# Patient Record
Sex: Male | Born: 1960 | Race: Black or African American | Hispanic: No | Marital: Married | Smoking: Former smoker
Health system: Southern US, Community
[De-identification: ages and names within clinical notes are randomized; demographics above are authoritative.]

## PROBLEM LIST (undated history)

## (undated) DIAGNOSIS — I10 Essential (primary) hypertension: Secondary | ICD-10-CM

## (undated) DIAGNOSIS — M722 Plantar fascial fibromatosis: Secondary | ICD-10-CM

## (undated) HISTORY — DX: Plantar fascial fibromatosis: M72.2

## (undated) HISTORY — PX: MOUTH SURGERY: SHX715

## (undated) HISTORY — PX: TONSILLECTOMY: SHX5217

## (undated) HISTORY — DX: Essential (primary) hypertension: I10

---

## 2004-07-26 ENCOUNTER — Emergency Department: Payer: Self-pay | Admitting: General Practice

## 2013-05-02 ENCOUNTER — Encounter: Payer: Self-pay | Admitting: Podiatry

## 2013-05-15 ENCOUNTER — Encounter: Payer: Self-pay | Admitting: Podiatry

## 2013-05-15 ENCOUNTER — Ambulatory Visit: Payer: Self-pay

## 2013-05-15 ENCOUNTER — Ambulatory Visit (INDEPENDENT_AMBULATORY_CARE_PROVIDER_SITE_OTHER): Payer: BC Managed Care – PPO | Admitting: Podiatry

## 2013-05-15 VITALS — BP 144/96 | HR 79 | Resp 16 | Ht 69.0 in | Wt 259.0 lb

## 2013-05-15 DIAGNOSIS — M722 Plantar fascial fibromatosis: Secondary | ICD-10-CM

## 2013-05-15 DIAGNOSIS — M76829 Posterior tibial tendinitis, unspecified leg: Secondary | ICD-10-CM

## 2013-05-15 DIAGNOSIS — M76822 Posterior tibial tendinitis, left leg: Secondary | ICD-10-CM

## 2013-05-15 MED ORDER — METHYLPREDNISOLONE (PAK) 4 MG PO TABS: ORAL_TABLET | ORAL | Status: DC

## 2013-05-15 MED ORDER — DICLOFENAC SODIUM 75 MG PO TBEC: 75.0000 mg | DELAYED_RELEASE_TABLET | Freq: Two times a day (BID) | ORAL | Status: DC

## 2013-05-15 NOTE — Progress Notes (Signed)
   Subjective:    Patient ID: Geroge BasemanDoug Russom, male    DOB: March 22, 1960, 53 y.o.   MRN: [id number]  HPI Comments: i have plantar fasciitis in this left foot again. Its even starting to make my ankle hurt. Its been hurting about 1 month , stretching , heat, using diclofenac . On concrete floor for 8 hours.      Review of Systems  All other systems reviewed and are negative.      Objective:   Physical Exam: I have reviewed his past mental history medications allergies surgeries social history and review of systems. Pulses are palpable left lower extremity. He has pain on palpation medial continued tubercle of the left heel with pes planus. He also has pain on palpation of the posterior tibial tendon with fluctuance within the tendon sheath.        Assessment & Plan:  Assessment: Pes planus with plantar fasciitis and posterior tibial tendinitis left foot.  Plan: Discussed etiology pathology conservative versus surgical therapies. I injected his left heel today with Kenalog and local anesthetic put him in a plantar fascial brace dispensed a prescription for Medrol Dosepak [facility].

## 2013-05-15 NOTE — Patient Instructions (Signed)

## 2013-06-12 ENCOUNTER — Ambulatory Visit: Payer: BC Managed Care – PPO | Admitting: Podiatry

## 2013-07-10 ENCOUNTER — Ambulatory Visit: Payer: BC Managed Care – PPO | Admitting: Podiatry

## 2013-12-31 ENCOUNTER — Other Ambulatory Visit: Payer: Self-pay | Admitting: *Deleted

## 2013-12-31 MED ORDER — DICLOFENAC SODIUM 75 MG PO TBEC: 75.0000 mg | DELAYED_RELEASE_TABLET | Freq: Two times a day (BID) | ORAL | Status: DC

## 2013-12-31 NOTE — Telephone Encounter (Signed)
[facility] sent refill request for diclofenac 75 mg #60 with 3 refills. Per dr Al Corpushyatt refill.

## 2014-04-23 DIAGNOSIS — I10 Essential (primary) hypertension: Secondary | ICD-10-CM | POA: Insufficient documentation

## 2014-04-23 DIAGNOSIS — E7800 Pure hypercholesterolemia, unspecified: Secondary | ICD-10-CM | POA: Insufficient documentation

## 2014-06-04 ENCOUNTER — Telehealth: Payer: Self-pay | Admitting: *Deleted

## 2014-06-04 NOTE — Telephone Encounter (Signed)
Please refill.

## 2014-06-04 NOTE — Telephone Encounter (Signed)
Pt request refill of Diclofenac 75mg .  LOV 05/2013

## 2014-06-05 MED ORDER — DICLOFENAC SODIUM 75 MG PO TBEC: 75.0000 mg | DELAYED_RELEASE_TABLET | Freq: Two times a day (BID) | ORAL | Status: DC

## 2014-08-05 DIAGNOSIS — Z0000 Encounter for general adult medical examination without abnormal findings: Secondary | ICD-10-CM | POA: Insufficient documentation

## 2015-06-30 ENCOUNTER — Other Ambulatory Visit: Payer: Self-pay | Admitting: Physician Assistant

## 2015-06-30 DIAGNOSIS — M5442 Lumbago with sciatica, left side: Secondary | ICD-10-CM

## 2015-07-24 ENCOUNTER — Ambulatory Visit: Admission: RE | Admit: 2015-07-24 | Payer: BLUE CROSS/BLUE SHIELD | Source: Ambulatory Visit

## 2015-07-24 ENCOUNTER — Ambulatory Visit
Admission: RE | Admit: 2015-07-24 | Discharge: 2015-07-24 | Disposition: A | Discharge status: Home / Self Care | Payer: BLUE CROSS/BLUE SHIELD | Source: Ambulatory Visit | Attending: Physician Assistant | Admitting: Physician Assistant

## 2015-07-24 DIAGNOSIS — M5136 Other intervertebral disc degeneration, lumbar region: Secondary | ICD-10-CM | POA: Insufficient documentation

## 2015-07-24 DIAGNOSIS — M5127 Other intervertebral disc displacement, lumbosacral region: Secondary | ICD-10-CM | POA: Diagnosis not present

## 2015-07-24 DIAGNOSIS — M5442 Lumbago with sciatica, left side: Secondary | ICD-10-CM | POA: Diagnosis present

## 2016-02-09 ENCOUNTER — Ambulatory Visit (INDEPENDENT_AMBULATORY_CARE_PROVIDER_SITE_OTHER): Payer: BLUE CROSS/BLUE SHIELD

## 2016-02-09 ENCOUNTER — Ambulatory Visit (INDEPENDENT_AMBULATORY_CARE_PROVIDER_SITE_OTHER): Payer: BLUE CROSS/BLUE SHIELD | Admitting: Podiatry

## 2016-02-09 ENCOUNTER — Encounter: Payer: Self-pay | Admitting: Podiatry

## 2016-02-09 VITALS — BP 128/84 | HR 91 | Resp 16

## 2016-02-09 DIAGNOSIS — M722 Plantar fascial fibromatosis: Secondary | ICD-10-CM

## 2016-02-09 DIAGNOSIS — R52 Pain, unspecified: Secondary | ICD-10-CM | POA: Diagnosis not present

## 2016-02-09 DIAGNOSIS — M79671 Pain in right foot: Secondary | ICD-10-CM

## 2016-02-09 MED ORDER — NONFORMULARY OR COMPOUNDED ITEM: 1.0000 g | Freq: Four times a day (QID) | 2 refills | Status: DC

## 2016-02-14 MED ORDER — BETAMETHASONE SOD PHOS & ACET 6 (3-3) MG/ML IJ SUSP: 3.0000 mg | Freq: Once | INTRAMUSCULAR | Status: DC

## 2016-02-14 NOTE — Progress Notes (Signed)
Subjective: Patient presents today for pain and tenderness in the right foot. Patient states the foot pain has been hurting for several weeks now. Patient states that it hurts in the mornings with the first steps out of bed. Patient presents today for further treatment and evaluation  Objective: Physical Exam General: The patient is alert and oriented x3 in no acute distress.  Dermatology: Skin is warm, dry and supple bilateral lower extremities. Negative for open lesions or macerations bilateral.   Vascular: Dorsalis Pedis and Posterior Tibial pulses palpable bilateral.  Capillary fill time is immediate to all digits.  Neurological: Epicritic and protective threshold intact bilateral.   Musculoskeletal: Tenderness to palpation at the medial calcaneal tubercale and through the insertion of the plantar fascia of the right foot. All other joints range of motion within normal limits bilateral. Strength 5/5 in all groups bilateral.   Radiographic exam: Normal osseous mineralization. Joint spaces preserved. No fracture/dislocation/boney destruction. Calcaneal spur present with mild thickening of plantar fascia right. No other soft tissue abnormalities or radiopaque foreign bodies.   Assessment: 1. Plantar fasciitis right 2. Pain in right foot  Plan of Care:  1. Patient evaluated. Xrays reviewed.   2. Injection of 0.5cc Celestone soluspan injected into the right heel at the insertion of the plantar fascia.  3. Instructed patient regarding therapies and modalities at home to alleviate symptoms.  4. Patient cannot take oral NSAIDs due to chronic hypertension  5. Rx for anti-inflammatory pain cream dispensed through [facility]. 6. Patient states that the plantar fascial brace does not help, he has one at home 7. Return to clinic in 4 weeks.     Felecia ShellingBrent M. Barbette Mcglaun, DPM [facility]  Dr. Felecia ShellingBrent M. Harith Mccadden, DPM    [address]                                          [place], [place] [address]                Office [phone]) [phone]  Fax [phone](336) [phone]

## 2016-02-18 ENCOUNTER — Ambulatory Visit: Payer: BLUE CROSS/BLUE SHIELD | Admitting: Podiatry

## 2016-03-08 ENCOUNTER — Ambulatory Visit (INDEPENDENT_AMBULATORY_CARE_PROVIDER_SITE_OTHER): Payer: BLUE CROSS/BLUE SHIELD | Admitting: Podiatry

## 2016-03-08 ENCOUNTER — Encounter: Payer: Self-pay | Admitting: Podiatry

## 2016-03-08 DIAGNOSIS — M7752 Other enthesopathy of left foot: Secondary | ICD-10-CM | POA: Diagnosis not present

## 2016-03-08 DIAGNOSIS — M79671 Pain in right foot: Secondary | ICD-10-CM | POA: Diagnosis not present

## 2016-03-08 DIAGNOSIS — M25572 Pain in left ankle and joints of left foot: Secondary | ICD-10-CM | POA: Diagnosis not present

## 2016-03-08 DIAGNOSIS — M659 Synovitis and tenosynovitis, unspecified: Secondary | ICD-10-CM | POA: Diagnosis not present

## 2016-03-08 DIAGNOSIS — M722 Plantar fascial fibromatosis: Secondary | ICD-10-CM

## 2016-03-08 MED ORDER — IBUPROFEN 800 MG PO TABS: 800.0000 mg | ORAL_TABLET | Freq: Three times a day (TID) | ORAL | 1 refills | Status: DC | PRN

## 2016-03-18 MED ORDER — BETAMETHASONE SOD PHOS & ACET 6 (3-3) MG/ML IJ SUSP: 3.0000 mg | Freq: Once | INTRAMUSCULAR | Status: DC

## 2016-03-18 NOTE — Progress Notes (Signed)
Subjective: Patient presents today for follow-up evaluation of plantar fasciitis to the right foot. Patient states that he is is doing much better however the begin hurting approximately 2 weeks ago. Patient received approximately 2 weeks of relief from the injection. Patient received compounding cream N states it did not really help.  Objective: Physical Exam General: The patient is alert and oriented x3 in no acute distress.  Dermatology: Skin is warm, dry and supple bilateral lower extremities. Negative for open lesions or macerations bilateral.   Vascular: Dorsalis Pedis and Posterior Tibial pulses palpable bilateral.  Capillary fill time is immediate to all digits.  Neurological: Epicritic and protective threshold intact bilateral.   Musculoskeletal: Tenderness to palpation at the medial calcaneal tubercale and through the insertion of the plantar fascia of the right foot. All other joints range of motion within normal limits bilateral. Strength 5/5 in all groups bilateral.  Pain on palpation also noted to the anterior medial and lateral aspects of the patient's left ankle joint. Likely due to compensation  Assessment: 1. Plantar fasciitis right 2. Pain in right foot 3. Left ankle pain and capsulitis secondary to compensation  Plan of Care:  1. Patient evaluated. Xrays reviewed.   2. Injection of 0.5cc Celestone soluspan injected into the right heel at the insertion of the plantar fascia.  3. Instructed patient regarding therapies and modalities at home to alleviate symptoms.  4. Patient is known to tolerate ibuprofen 800 mg. Prescription for Motrin 800 5. Continue anti-inflammatory pain cream through [facility]  6. Patient states that the plantar fascial brace does not help, he has one at home. Continue plantar fascial brace 7. Injection of 0.5 mL Celestone Soluspan injected into the patient's left ankle joint. 8. Return to clinic in 4 weeks  Felecia ShellingBrent M. Eleny Cortez, DPM [facility]  Dr. Felecia ShellingBrent M. Rhyland Hinderliter, DPM    [address]                                        [place], [place] [address]                Office [phone]  Fax [phone](336) [phone]

## 2016-04-05 ENCOUNTER — Ambulatory Visit (INDEPENDENT_AMBULATORY_CARE_PROVIDER_SITE_OTHER): Payer: BLUE CROSS/BLUE SHIELD | Admitting: Podiatry

## 2016-04-05 ENCOUNTER — Encounter: Payer: Self-pay | Admitting: Podiatry

## 2016-04-05 DIAGNOSIS — M659 Synovitis and tenosynovitis, unspecified: Secondary | ICD-10-CM

## 2016-04-05 DIAGNOSIS — M79671 Pain in right foot: Secondary | ICD-10-CM

## 2016-04-05 DIAGNOSIS — M25572 Pain in left ankle and joints of left foot: Secondary | ICD-10-CM | POA: Diagnosis not present

## 2016-04-05 DIAGNOSIS — M7752 Other enthesopathy of left foot: Secondary | ICD-10-CM

## 2016-04-05 DIAGNOSIS — M722 Plantar fascial fibromatosis: Secondary | ICD-10-CM | POA: Diagnosis not present

## 2016-04-20 MED ORDER — BETAMETHASONE SOD PHOS & ACET 6 (3-3) MG/ML IJ SUSP: 12.0000 mg | Freq: Once | INTRAMUSCULAR | Status: DC

## 2016-04-20 NOTE — Progress Notes (Signed)
Subjective: Patient presents today for follow-up evaluation of plantar fasciitis to the right foot. Patient also states that he has some left ankle pain and foot pain as well today. Patient states that his feet are sore in the continue to hurt. There is little improvement between last visit and today's visit.  Objective: Physical Exam General: The patient is alert and oriented x3 in no acute distress.  Dermatology: Skin is warm, dry and supple bilateral lower extremities. Negative for open lesions or macerations bilateral.   Vascular: Dorsalis Pedis and Posterior Tibial pulses palpable bilateral.  Capillary fill time is immediate to all digits.  Neurological: Epicritic and protective threshold intact bilateral.   Musculoskeletal: Tenderness to palpation at the medial calcaneal tubercale and through the insertion of the plantar fascia of the right foot. All other joints range of motion within normal limits bilateral. Strength 5/5 in all groups bilateral.  Pain on palpation also noted to the anterior medial and lateral aspects of the patient's left ankle joint. Likely due to compensation  Assessment: 1. Plantar fasciitis right 2. Pain in right foot 3. Left ankle pain synovitis secondary to compensation  Plan of Care:  1. Patient evaluated. Xrays reviewed.   2. Injection of 0.5cc Celestone soluspan injected into the right heel at the insertion of the plantar fascia.  3. Injection of 0.5 mL Celestone Soluspan injected to the left ankle joint. 4. Recommend over-the-counter inserts at [facility] 5. Today an ankle brace was dispensed for the left lower extremity. 6. Today we discussed conservative versus surgical management of the chronic left ankle pain. Return in a continue conservative management at the moment. 7. Continue Motrin 800 8. Continue anti-inflammatory pain cream through [facility] 9. Continue plantar fascial brace. Patient states the plantar fascial brace does not  help 10. Return to clinic in 4 weeks  Felecia ShellingBrent M. Odell Choung, DPM [facility]  Dr. Felecia ShellingBrent M. Lulabelle Desta, DPM    [address]                                        [place], [place] [address]                Office [phone]) [phone]  Fax [phone](336) [phone]

## 2016-05-06 ENCOUNTER — Ambulatory Visit: Payer: BLUE CROSS/BLUE SHIELD | Admitting: Podiatry

## 2016-05-31 ENCOUNTER — Ambulatory Visit (INDEPENDENT_AMBULATORY_CARE_PROVIDER_SITE_OTHER): Payer: BLUE CROSS/BLUE SHIELD | Admitting: Podiatry

## 2016-05-31 ENCOUNTER — Encounter: Payer: Self-pay | Admitting: Podiatry

## 2016-05-31 DIAGNOSIS — M659 Synovitis and tenosynovitis, unspecified: Secondary | ICD-10-CM

## 2016-05-31 DIAGNOSIS — M722 Plantar fascial fibromatosis: Secondary | ICD-10-CM

## 2016-06-01 NOTE — Progress Notes (Signed)
Subjective: Patient presents today for follow-up evaluation of plantar fasciitis to the right foot and left ankle pain. Patient states the right heel is still sore and the left ankle is still painful. She reports moderate relief with the injection. She denies any new complaints, trauma or injury.  Objective: Physical Exam General: The patient is alert and oriented x3 in no acute distress.  Dermatology: Skin is warm, dry and supple bilateral lower extremities. Negative for open lesions or macerations bilateral.   Vascular: Dorsalis Pedis and Posterior Tibial pulses palpable bilateral.  Capillary fill time is immediate to all digits.  Neurological: Epicritic and protective threshold intact bilateral.   Musculoskeletal: Tenderness to palpation at the medial calcaneal tubercale and through the insertion of the plantar fascia of the right foot. All other joints range of motion within normal limits bilateral. Strength 5/5 in all groups bilateral.  Pain on palpation also noted to the anterior medial and lateral aspects of the patient's left ankle joint. Likely due to compensation  Assessment: 1. Plantar fasciitis right 2. Pain in right foot 3. Left ankle pain synovitis  Plan of Care:  1. Patient evaluated. 2. Injection of 0.5cc Celestone soluspan injected into the right heel at the insertion of the plantar fascia.  3. Injection of 0.5 mL Celestone Soluspan injected to the left ankle joint. 4. Recommend over-the-counter inserts at [facility] 5. Continue wearing left ankle brace 6. Continue Motrin 800 7. Continue anti-inflammatory pain cream through [facility] 8. Continue plantar fascial brace.  9. Return to clinic in 4-6 weeks  Felecia Shelling, DPM [facility]  Dr. Felecia Shelling, DPM    [address]                                        [place], [place] [address]                Office [phone]  Fax [phone]

## 2016-06-02 MED ORDER — BETAMETHASONE SOD PHOS & ACET 6 (3-3) MG/ML IJ SUSP: 3.0000 mg | Freq: Once | INTRAMUSCULAR | Status: DC

## 2016-06-21 ENCOUNTER — Other Ambulatory Visit: Payer: Self-pay

## 2016-06-21 MED ORDER — IBUPROFEN 800 MG PO TABS: 800.0000 mg | ORAL_TABLET | Freq: Three times a day (TID) | ORAL | 0 refills | Status: DC | PRN

## 2016-06-28 ENCOUNTER — Encounter: Payer: Self-pay | Admitting: Podiatry

## 2016-06-28 ENCOUNTER — Ambulatory Visit (INDEPENDENT_AMBULATORY_CARE_PROVIDER_SITE_OTHER): Payer: BLUE CROSS/BLUE SHIELD | Admitting: Podiatry

## 2016-06-28 DIAGNOSIS — M722 Plantar fascial fibromatosis: Secondary | ICD-10-CM

## 2016-06-28 DIAGNOSIS — M65972 Unspecified synovitis and tenosynovitis, left ankle and foot: Secondary | ICD-10-CM

## 2016-06-28 DIAGNOSIS — M659 Synovitis and tenosynovitis, unspecified: Secondary | ICD-10-CM | POA: Diagnosis not present

## 2016-06-30 NOTE — Progress Notes (Signed)
Subjective: Patient presents today for follow-up evaluation of plantar fasciitis to the right foot and left ankle pain. Patient states his right foot is somewhat better. He states the left ankle pain is the same. Patient reports some relief with the injection at last visit and with wearing the ankle brace. He has been using the pain cream and reports no significant relief.  Objective: Physical Exam General: The patient is alert and oriented x3 in no acute distress.  Dermatology: Skin is warm, dry and supple bilateral lower extremities. Negative for open lesions or macerations bilateral.   Vascular: Dorsalis Pedis and Posterior Tibial pulses palpable bilateral.  Capillary fill time is immediate to all digits.  Neurological: Epicritic and protective threshold intact bilateral.   Musculoskeletal: Tenderness to palpation at the medial calcaneal tubercale and through the insertion of the plantar fascia of the right foot. All other joints range of motion within normal limits bilateral. Strength 5/5 in all groups bilateral.  Pain on palpation also noted to the anterior medial and lateral aspects of the patient's left ankle joint. Likely due to compensation  Assessment: 1. Plantar fasciitis right 2. Pain in right foot 3. Left ankle pain synovitis  Plan of Care:  1. Patient evaluated. 2. Injection of 0.5cc Celestone soluspan injected into the right heel at the insertion of the plantar fascia.  3. Injection of 0.5 mL Celestone Soluspan injected to the left ankle joint. 4. Continue taking Motrin 800 mg. 5. Continue wearing plantar fasciitis brace. 6. Continue wearing ankle brace. 7. Return to clinic in 5 weeks.  Felecia ShellingBrent M. Evans, DPM [facility]  Dr. Felecia ShellingBrent M. Evans, DPM    [address]                                        [place], [place] [address]                Office [phone]([phone]  Fax [phone](336) [phone]

## 2016-07-18 MED ORDER — BETAMETHASONE SOD PHOS & ACET 6 (3-3) MG/ML IJ SUSP: 3.0000 mg | Freq: Once | INTRAMUSCULAR | Status: DC

## 2016-08-02 ENCOUNTER — Encounter: Payer: Self-pay | Admitting: Podiatry

## 2016-08-02 ENCOUNTER — Ambulatory Visit (INDEPENDENT_AMBULATORY_CARE_PROVIDER_SITE_OTHER): Payer: BLUE CROSS/BLUE SHIELD | Admitting: Podiatry

## 2016-08-02 DIAGNOSIS — M722 Plantar fascial fibromatosis: Secondary | ICD-10-CM | POA: Diagnosis not present

## 2016-08-02 MED ORDER — IBUPROFEN 800 MG PO TABS: 800.0000 mg | ORAL_TABLET | Freq: Three times a day (TID) | ORAL | 1 refills | Status: DC | PRN

## 2016-08-02 MED ORDER — TRAMADOL HCL 50 MG PO TABS: 50.0000 mg | ORAL_TABLET | Freq: Three times a day (TID) | ORAL | 0 refills | Status: DC | PRN

## 2016-08-05 NOTE — Progress Notes (Signed)
Subjective: Patient presents today for follow-up evaluation of right plantar fasciitis and left ankle pain. He states the right foot pain has worsened and the left ankle pain is only slightly better. He is requesting another injection stating it helps for about 2-3 weeks. He has also been taking Motrin with some relief of the pain. He denies any new complaints at this time.  Objective: Physical Exam General: The patient is alert and oriented x3 in no acute distress.  Dermatology: Skin is warm, dry and supple bilateral lower extremities. Negative for open lesions or macerations bilateral.   Vascular: Dorsalis Pedis and Posterior Tibial pulses palpable bilateral.  Capillary fill time is immediate to all digits.  Neurological: Epicritic and protective threshold intact bilateral.   Musculoskeletal: Tenderness to palpation at the medial calcaneal tubercale and through the insertion of the plantar fascia of the right foot. All other joints range of motion within normal limits bilateral. Strength 5/5 in all groups bilateral.  Pain on palpation also noted to the anterior medial and lateral aspects of the patient's left ankle joint. Likely due to compensation  Assessment: 1. Plantar fasciitis right   Plan of Care:  1. Patient evaluated. 2. Injection of 0.5cc Celestone soluspan injected into the right heel at the insertion of the plantar fascia.  3. Refill prescription for Motrin 800 mg given to patient. 4. Prescription for Tramadol 50 mg given to patient.  5. Return to clinic in 4 weeks.   Felecia ShellingBrent M. Evans, DPM [facility]  Dr. Felecia ShellingBrent M. Evans, DPM    [address]                                        [place], [place] [address]                Office [phone](336) [phone]  Fax [phone](336) [phone]

## 2016-08-06 MED ORDER — BETAMETHASONE SOD PHOS & ACET 6 (3-3) MG/ML IJ SUSP: 3.0000 mg | Freq: Once | INTRAMUSCULAR | Status: DC

## 2016-09-02 ENCOUNTER — Encounter: Payer: Self-pay | Admitting: Podiatry

## 2016-09-02 ENCOUNTER — Ambulatory Visit (INDEPENDENT_AMBULATORY_CARE_PROVIDER_SITE_OTHER): Payer: BLUE CROSS/BLUE SHIELD | Admitting: Podiatry

## 2016-09-02 DIAGNOSIS — M722 Plantar fascial fibromatosis: Secondary | ICD-10-CM

## 2016-09-02 MED ORDER — BETAMETHASONE SOD PHOS & ACET 6 (3-3) MG/ML IJ SUSP: 3.0000 mg | Freq: Once | INTRAMUSCULAR | Status: DC

## 2016-09-02 NOTE — Progress Notes (Signed)
Subjective: Patient presents today for follow-up evaluation of right plantar fasciitis and left ankle pain.  Patient states the pain is the same. 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 Patient states that the injections only gave him temporary relief and he is not in a position to take 34 weeks off of work. Patient presents today for further treatment and evaluation  Objective: Physical Exam General: The patient is alert and oriented x3 in no acute distress.  Dermatology: Skin is warm, dry and supple bilateral lower extremities. Negative for open lesions or macerations bilateral.   Vascular: Dorsalis Pedis and Posterior Tibial pulses palpable bilateral.  Capillary fill time is immediate to all digits.  Neurological: Epicritic and protective threshold intact bilateral.   Musculoskeletal: Tenderness to palpation at the medial calcaneal tubercale and through the insertion of the plantar fascia of the right foot. All other joints range of motion within normal limits bilateral. Strength 5/5 in all groups bilateral.  Pain on palpation also noted to the anterior medial and lateral aspects of the patient's left ankle joint. Likely due to compensation  Assessment: 1. Plantar fasciitis right -  chronic   Plan of Care:  1. Patient evaluated. 2. Injection of 0.5cc Celestone soluspan injected into the right heel at the insertion of the plantar fascia.  3. Today we had a very frank discussio talking about the different treatment options that are available to him including shockwave, and ultimately surgery. The patient is not a position to do shockwave or take time off for surgery. He is exhausted all conservative modalities including the plantar fascial brace, night splint, oral anti-inflammatories,  Anti-inflammatory injections,  And stretching,  Good shoe gear,  And insoles.  At this point the  patient would be a good candidate for shockwave or surgery however he has declined both at the moment.  4.  Patient also states that he cannot tolerate oral NSAIDs although he has been taking Motrin 800 mg when necessary. Continue if it does not elevate his blood pressure. The patient's concerned with blood pressure  5. Patient has also had a Medrol Dosepak in the past however he does not like taking it.  Patient declined additional prednisone orally  6. Return to clinic in 4-6 weeks  Felecia ShellingBrent M. Manasvi Dickard, DPM [facility]  Dr. Felecia ShellingBrent M. Akylah Hascall, DPM    [address]                                        [place], [place] [address]                Office [phone]) [phone]  Fax [phone](336) [phone]

## 2016-09-09 ENCOUNTER — Telehealth: Payer: Self-pay | Admitting: *Deleted

## 2016-09-09 MED ORDER — IBUPROFEN 800 MG PO TABS: 800.0000 mg | ORAL_TABLET | Freq: Three times a day (TID) | ORAL | 1 refills | Status: DC | PRN

## 2016-09-09 NOTE — Telephone Encounter (Signed)
Received refill request for Ibuprofen. Dr. Logan BoresEvans states refill as previously.

## 2016-09-30 ENCOUNTER — Ambulatory Visit (INDEPENDENT_AMBULATORY_CARE_PROVIDER_SITE_OTHER): Payer: BLUE CROSS/BLUE SHIELD | Admitting: Podiatry

## 2016-09-30 ENCOUNTER — Encounter: Payer: Self-pay | Admitting: Podiatry

## 2016-09-30 DIAGNOSIS — M722 Plantar fascial fibromatosis: Secondary | ICD-10-CM

## 2016-09-30 MED ORDER — IBUPROFEN-FAMOTIDINE 800-26.6 MG PO TABS: 1.0000 | ORAL_TABLET | Freq: Three times a day (TID) | ORAL | 2 refills | Status: DC

## 2016-10-08 MED ORDER — BETAMETHASONE SOD PHOS & ACET 6 (3-3) MG/ML IJ SUSP: 3.0000 mg | Freq: Once | INTRAMUSCULAR | Status: DC

## 2016-10-08 NOTE — Progress Notes (Signed)
Subjective: Patient presents today for follow-up evaluation of right plantar fasciitis and left ankle pain.  Patient states the pain is the same. 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 Patient states that the injections only gave him temporary relief and he is not in a position to take 3-4 weeks off of work. Patient presents today for further treatment and evaluation Patient states that today he has some new pain and tenderness to the left heel secondary to compensation and applying extra pressure to alleviate right lower extremity pressure.  Objective: Physical Exam General: The patient is alert and oriented x3 in no acute distress.  Dermatology: Skin is warm, dry and supple bilateral lower extremities. Negative for open lesions or macerations bilateral.   Vascular: Dorsalis Pedis and Posterior Tibial pulses palpable bilateral.  Capillary fill time is immediate to all digits.  Neurological: Epicritic and protective threshold intact bilateral.   Musculoskeletal: Tenderness to palpation at the medial calcaneal tubercale and through the insertion of the plantar fascia of the bilateral foot. All other joints range of motion within normal limits bilateral. Strength 5/5 in all groups bilateral.  Pain on palpation also noted to the anterior medial and lateral aspects of the patient's left ankle joint. Likely due to compensation  Assessment: 1. Plantar fasciitis right -  Chronic 2. Plantar fasciitis left   Plan of Care:  1. Patient evaluated. 2. Injection of 0.5cc Celestone soluspan injected into the right heel at the insertion of the plantar fascia.  3. Today we had a very frank discussio talking about the different treatment options that are available to him including shockwave, and ultimately surgery. The patient is not a position to do shockwave or take time off for surgery. He is  exhausted all conservative modalities including the plantar fascial brace, night splint, oral anti-inflammatories,  Anti-inflammatory injections,  And stretching,  Good shoe gear,  And insoles.  At this point the patient would be a good candidate for shockwave or surgery however he has declined both at the moment.  4.  Patient also states that he cannot tolerate oral NSAIDs although he has been taking Motrin 800 mg when necessary. Continue if it does not elevate his blood pressure. The patient's concerned with blood pressure  5. Patient has also had a Medrol Dosepak in the past however he does not like taking it.  Patient declined additional prednisone orally  6. Return to clinic in 4-6 weeks  Felecia ShellingBrent M. Evans, DPM [facility]  Dr. Felecia ShellingBrent M. Evans, DPM    [address]                                        [place], [place] [address]                Office [phone](336) [phone]  Fax [phone](336) [phone]

## 2016-10-28 ENCOUNTER — Other Ambulatory Visit: Payer: Self-pay

## 2016-10-28 MED ORDER — IBUPROFEN 800 MG PO TABS: 800.0000 mg | ORAL_TABLET | Freq: Three times a day (TID) | ORAL | 0 refills | Status: DC | PRN

## 2016-11-11 ENCOUNTER — Ambulatory Visit: Payer: BLUE CROSS/BLUE SHIELD | Admitting: Podiatry

## 2016-11-16 ENCOUNTER — Other Ambulatory Visit: Payer: Self-pay | Admitting: *Deleted

## 2016-11-16 MED ORDER — IBUPROFEN 800 MG PO TABS: 800.0000 mg | ORAL_TABLET | Freq: Three times a day (TID) | ORAL | 1 refills | Status: DC | PRN

## 2017-01-06 DIAGNOSIS — M5430 Sciatica, unspecified side: Secondary | ICD-10-CM | POA: Insufficient documentation

## 2017-01-13 ENCOUNTER — Ambulatory Visit: Payer: BLUE CROSS/BLUE SHIELD | Admitting: Podiatry

## 2017-01-13 DIAGNOSIS — M79671 Pain in right foot: Secondary | ICD-10-CM

## 2017-01-13 DIAGNOSIS — M722 Plantar fascial fibromatosis: Secondary | ICD-10-CM | POA: Diagnosis not present

## 2017-01-13 MED ORDER — IBUPROFEN 800 MG PO TABS: 800.0000 mg | ORAL_TABLET | Freq: Three times a day (TID) | ORAL | 1 refills | Status: DC | PRN

## 2017-01-16 NOTE — Progress Notes (Signed)
   Subjective: Patient presents today for follow-up evaluation of bilateral plantar fasciitis.  He states the pain in the left foot has improved significantly.  He reports continued pain in the right foot and rates it at 9/10.  He has been taking Motrin 800 mg as needed for the pain. Patient presents today for further treatment and evaluation.   Past Medical History:  Diagnosis Date  . Hypertension   . Plantar fasciitis      Objective: Physical Exam General: The patient is alert and oriented x3 in no acute distress.  Dermatology: Skin is warm, dry and supple bilateral lower extremities. Negative for open lesions or macerations bilateral.   Vascular: Dorsalis Pedis and Posterior Tibial pulses palpable bilateral.  Capillary fill time is immediate to all digits.  Neurological: Epicritic and protective threshold intact bilateral.   Musculoskeletal: Tenderness to palpation at the medial calcaneal tubercale and through the insertion of the plantar fascia of the right foot. All other joints range of motion within normal limits bilateral. Strength 5/5 in all groups bilateral.    Assessment: 1. Plantar fasciitis right- chronic she is 2. Pain in right foot  Plan of Care:  1. Patient evaluated.  MRI of the right heel to rule out any other pathology placed today. 2. Injection of 0.5cc Celestone soluspan injected into the right plantar fascia  3. Orders for MRI of the right heel placed today to rule out any other pathology. 4.  Refill prescription for Motrin 800 mg provided to patient. 5.  Return to clinic in 6 weeks.   Felecia ShellingBrent M. Nathania Waldman, DPM [facility]  Dr. Felecia ShellingBrent M. Dariella Gillihan, DPM    [address]                                        [place], [place] [address]                Office [phone]) [phone]  Fax [phone]([phone]

## 2017-01-19 NOTE — Addendum Note (Signed)
Addended by: Geraldine ContrasVENABLE, Marti Acebo D on: 01/19/2017 11:50 AM   Modules accepted: Orders

## 2017-05-26 ENCOUNTER — Ambulatory Visit: Payer: BLUE CROSS/BLUE SHIELD | Admitting: Podiatry

## 2017-06-02 ENCOUNTER — Ambulatory Visit: Payer: BLUE CROSS/BLUE SHIELD | Admitting: Podiatry

## 2018-01-18 ENCOUNTER — Other Ambulatory Visit: Payer: Self-pay

## 2018-01-18 ENCOUNTER — Telehealth: Payer: Self-pay | Admitting: Podiatry

## 2018-01-18 MED ORDER — IBUPROFEN 800 MG PO TABS: 800.0000 mg | ORAL_TABLET | Freq: Three times a day (TID) | ORAL | 0 refills | Status: DC | PRN

## 2018-01-18 NOTE — Telephone Encounter (Signed)
Patient is requesting refill for Ibuprofen 800mg.  Per Dr. Evans verbal order, ok to give refill.  He will need follow up visit.    Script has been sent to pharmacy 

## 2018-01-18 NOTE — Telephone Encounter (Signed)
Patient is requesting refill for Ibuprofen 800mg .  Per Dr. Logan BoresEvans verbal order, ok to give refill.  He will need follow up visit.    Script has been sent to pharmacy

## 2018-01-18 NOTE — Telephone Encounter (Signed)
Pt called requesting refill of Ibuprofen 800mg . Please give pt a call.

## 2018-01-18 NOTE — Addendum Note (Signed)
Addended by: Geraldine ContrasVENABLE, Colton Tassin D on: 01/18/2018 01:48 PM   Modules accepted: Orders

## 2018-10-29 ENCOUNTER — Other Ambulatory Visit: Payer: Self-pay

## 2018-10-29 ENCOUNTER — Ambulatory Visit: Payer: BLUE CROSS/BLUE SHIELD

## 2018-10-29 ENCOUNTER — Ambulatory Visit (INDEPENDENT_AMBULATORY_CARE_PROVIDER_SITE_OTHER): Payer: BC Managed Care – PPO | Admitting: Podiatry

## 2018-10-29 ENCOUNTER — Encounter: Payer: Self-pay | Admitting: Podiatry

## 2018-10-29 DIAGNOSIS — M2141 Flat foot [pes planus] (acquired), right foot: Secondary | ICD-10-CM

## 2018-10-29 DIAGNOSIS — M76822 Posterior tibial tendinitis, left leg: Secondary | ICD-10-CM

## 2018-10-29 DIAGNOSIS — M76829 Posterior tibial tendinitis, unspecified leg: Secondary | ICD-10-CM | POA: Diagnosis not present

## 2018-10-29 DIAGNOSIS — M76821 Posterior tibial tendinitis, right leg: Secondary | ICD-10-CM

## 2018-10-29 DIAGNOSIS — M2142 Flat foot [pes planus] (acquired), left foot: Secondary | ICD-10-CM

## 2018-10-29 DIAGNOSIS — M722 Plantar fascial fibromatosis: Secondary | ICD-10-CM

## 2018-10-29 MED ORDER — METHYLPREDNISOLONE 4 MG PO TBPK: ORAL_TABLET | ORAL | 0 refills | Status: DC

## 2018-10-29 MED ORDER — IBUPROFEN 800 MG PO TABS: 800.0000 mg | ORAL_TABLET | Freq: Three times a day (TID) | ORAL | 5 refills | Status: DC

## 2018-10-29 NOTE — Progress Notes (Signed)
  Subjective:  Patient ID: Ronald Spencer, male    DOB: 12-Jan-1961,  MRN: [id number] HPI No chief complaint on file.   58 y.o. male presents with the above complaint.   ROS: Denies fever chills nausea vomiting muscle aches pains calf pain back pain chest pain shortness of breath.  Severe foot and ankle pain secondary to walking the floors with still toes at [facility].  Past Medical History:  Diagnosis Date  . Hypertension   . Plantar fasciitis      Current Outpatient Medications:  .  amLODipine (NORVASC) 5 MG tablet, TAKE 1 TABLET DAILY, Disp: , Rfl:  .  gabapentin (NEURONTIN) 100 MG capsule, TAKE 1 CAPSULE THREE TIMES A DAY, Disp: , Rfl:  .  ibuprofen (ADVIL) 800 MG tablet, Take 1 tablet (800 mg total) by mouth 3 (three) times daily., Disp: 90 tablet, Rfl: 5 .  methylPREDNISolone (MEDROL DOSEPAK) 4 MG TBPK tablet, 6 day dose pack - take as directed, Disp: 21 tablet, Rfl: 0 .  Multiple Vitamin (MULTI-VITAMINS) TABS, Take by mouth., Disp: , Rfl:  .  traMADol (ULTRAM) 50 MG tablet, Take 1 tablet (50 mg total) by mouth every 8 (eight) hours as needed., Disp: 60 tablet, Rfl: 0  Allergies  Allergen Reactions  . Ace Inhibitors Cough   Review of Systems Objective:  There were no vitals filed for this visit.  General: Well developed, nourished, in no acute distress, alert and oriented x3   Dermatological: Skin is warm, dry and supple bilateral. Nails x 10 are well maintained; remaining integument appears unremarkable at this time. There are no open sores, no preulcerative lesions, no rash or signs of infection present.  Vascular: Dorsalis Pedis artery and Posterior Tibial artery pedal pulses are 2/4 bilateral with immedate capillary fill time. Pedal hair growth present. No varicosities and no lower extremity edema present bilateral.   Neruologic: Grossly intact via light touch bilateral. Vibratory intact via tuning fork bilateral. Protective threshold with Semmes Wienstein monofilament  intact to all pedal sites bilateral. Patellar and Achilles deep tendon reflexes 2+ bilateral. No Babinski or clonus noted bilateral.   Musculoskeletal: No gross boney pedal deformities bilateral. No pain, crepitus, or limitation noted with foot and ankle range of motion bilateral. Muscular strength 5/5 in all groups tested bilateral.  Severe pes planus is noted bilaterally with pain on palpation of the posterior tibial tendon bilaterally particularly at its insertion site on the plantar medial aspect of the foot.  Right foot appears to be worse than the left.  Gait: Unassisted, Nonantalgic.    Radiographs:  Radiographs unable to be taken today secondary to mechanical failure.  Assessment & Plan:   Assessment: Posterior tibial tendinitis with pes planus.  Plan: In this area I injected 20 mg Kenalog 5 mg Marcaine point maximal tenderness to the plantar medial aspect of the bilateral arch at the insertion site of the posterior tibial tendon plantarly.  Start him on a Medrol Dosepak to be followed by 800 mg of Motrin 3 times a day.  Follow-up with him in 1 month     Oliwia Berzins T. Lam[place], [place]

## 2018-11-26 ENCOUNTER — Encounter: Payer: Self-pay | Admitting: Podiatry

## 2018-11-26 ENCOUNTER — Ambulatory Visit (INDEPENDENT_AMBULATORY_CARE_PROVIDER_SITE_OTHER): Payer: BC Managed Care – PPO | Admitting: Podiatry

## 2018-11-26 ENCOUNTER — Other Ambulatory Visit: Payer: Self-pay

## 2018-11-26 DIAGNOSIS — M76821 Posterior tibial tendinitis, right leg: Secondary | ICD-10-CM

## 2018-11-26 DIAGNOSIS — M76822 Posterior tibial tendinitis, left leg: Secondary | ICD-10-CM

## 2018-11-26 DIAGNOSIS — M2580 Other specified joint disorders, unspecified joint: Secondary | ICD-10-CM | POA: Diagnosis not present

## 2018-11-26 NOTE — Progress Notes (Signed)
He presents today for follow-up of his posterior tibial tendinitis he states that is doing okay still hurts but is about 85% better.  If that he cannot wear his cam walker.  Objective: Vital signs are stable he is alert and oriented x3.  Pulses are palpable.  He has reproducible pain on palpation of the sesamoids and of the inferior aspect of the distalmost aspect of the posterior tibial tendon into the navicular insertion.  No wear near as painful as this was previously noted.  He also has some tenderness on palpation of the floor the sinus tarsi left foot.  Assessment: Resolving posterior tibial tendinitis and sesamoiditis approximately 85% resolved.  Right foot.  Sinus tarsitis left.  Plan: Discussed etiology pathology conservative versus surgical therapies at this point he requested another set of injections in sesamoid and the medial arch.  At this point 10 mg of Kenalog 5 mg Marcaine were injected into each area after sterile Betadine skin prep.  Tolerated procedure well.  At this point we decided not to treat the left foot.  Hopefully this will improve as compensation diminishes.  I will follow-up with him in 6 to 8 weeks.

## 2019-01-16 ENCOUNTER — Ambulatory Visit: Payer: BC Managed Care – PPO | Admitting: Podiatry

## 2019-06-06 ENCOUNTER — Ambulatory Visit: Payer: BLUE CROSS/BLUE SHIELD | Attending: Internal Medicine

## 2019-06-06 DIAGNOSIS — Z23 Encounter for immunization: Secondary | ICD-10-CM

## 2019-06-06 NOTE — Progress Notes (Signed)
   Covid-19 Vaccination Clinic  Name:  Ronald Spencer    MRN: [id number] DOB: [birth year]  06/06/2019  Ronald Spencer was observed post Covid-19 immunization for 15 minutes without incident. He was provided with Vaccine Information Sheet and instruction to access the V-Safe system.   Ronald Spencer was instructed to call 911 with any severe reactions post vaccine: Marland Kitchen Difficulty breathing  . Swelling of face and throat  . A fast heartbeat  . A bad rash all over body  . Dizziness and weakness   Immunizations Administered    Name Date Dose VIS Date Route   Moderna COVID-19 Vaccine 06/06/2019  2:59 PM 0.5 mL 01/2019 Intramuscular   Manufacturer: Gala Murdoch   Lot: [id number]   NDC: [id number]

## 2019-06-10 ENCOUNTER — Ambulatory Visit: Payer: BC Managed Care – PPO

## 2019-06-10 ENCOUNTER — Ambulatory Visit (INDEPENDENT_AMBULATORY_CARE_PROVIDER_SITE_OTHER): Payer: BC Managed Care – PPO | Admitting: Podiatry

## 2019-06-10 ENCOUNTER — Encounter: Payer: Self-pay | Admitting: Podiatry

## 2019-06-10 ENCOUNTER — Other Ambulatory Visit: Payer: Self-pay

## 2019-06-10 DIAGNOSIS — M1000 Idiopathic gout, unspecified site: Secondary | ICD-10-CM | POA: Diagnosis not present

## 2019-06-10 DIAGNOSIS — M76829 Posterior tibial tendinitis, unspecified leg: Secondary | ICD-10-CM | POA: Diagnosis not present

## 2019-06-10 DIAGNOSIS — M778 Other enthesopathies, not elsewhere classified: Secondary | ICD-10-CM | POA: Diagnosis not present

## 2019-06-10 MED ORDER — METHYLPREDNISOLONE 4 MG PO TBPK: ORAL_TABLET | ORAL | 0 refills | Status: DC

## 2019-06-10 MED ORDER — MELOXICAM 15 MG PO TABS: 15.0000 mg | ORAL_TABLET | Freq: Every day | ORAL | 3 refills | Status: DC

## 2019-06-10 NOTE — Progress Notes (Signed)
He presents today for follow-up of his posterior tibial tendinitis right foot he said the pain really never went away but it feels like is getting worse lately.  He states that is warm hot and swollen and painful.  Denies any further trauma.  Objective: Vital signs are stable he is alert oriented x3 he has pain on palpation medial aspect of the ankle with erythema and edema it is warm to the touch.  Contralateral foot does not feel that way.  It extends from the anterior medial ankle down to the knee the navicular tuberosity.  Assessment: Cannot rule out posterior tibial tendon dysfunction or tear associated with pes planus.  However I would like to rule out rheumatoid or gouty arthritis.  Plan: At this point we are going to go ahead and get started on decreasing the inflammation on it using a Medrol Dosepak to be followed by meloxicam.  I am going also seen by getting an MRI for a tear of the posterior tibial tendon in the plantar fascia.  I also am requesting arthritic profile.  Should arthritic profile come back normal we will go ahead with the MRI.

## 2019-06-11 LAB — CBC WITH DIFFERENTIAL/PLATELET
Basophils Absolute: 0 10*3/uL (ref 0.0–0.2)
Basos: 1 %
EOS (ABSOLUTE): 0.1 10*3/uL (ref 0.0–0.4)
Eos: 3 %
Hematocrit: 43.4 % (ref 37.5–51.0)
Hemoglobin: 14.6 g/dL (ref 13.0–17.7)
Immature Grans (Abs): 0 10*3/uL (ref 0.0–0.1)
Immature Granulocytes: 0 %
Lymphocytes Absolute: 1.7 10*3/uL (ref 0.7–3.1)
Lymphs: 41 %
MCH: 29.4 pg (ref 26.6–33.0)
MCHC: 33.6 g/dL (ref 31.5–35.7)
MCV: 88 fL (ref 79–97)
Monocytes Absolute: 0.3 10*3/uL (ref 0.1–0.9)
Monocytes: 7 %
Neutrophils Absolute: 2 10*3/uL (ref 1.4–7.0)
Neutrophils: 48 %
Platelets: 113 10*3/uL — ABNORMAL LOW (ref 150–450)
RBC: 4.96 x10E6/uL (ref 4.14–5.80)
RDW: 13.1 % (ref 11.6–15.4)
WBC: 4.2 10*3/uL (ref 3.4–10.8)

## 2019-06-11 LAB — ARTHRITIS PANEL
Anti Nuclear Antibody (ANA): NEGATIVE
Rheumatoid fact SerPl-aCnc: <10 IU/mL (ref 0.0–13.9)
Sed Rate: 21 mm/hr (ref 0–30)
Uric Acid: 5.8 mg/dL (ref 3.8–8.4)

## 2019-06-12 ENCOUNTER — Telehealth: Payer: Self-pay

## 2019-06-12 NOTE — Telephone Encounter (Signed)
I spoke with patient and informed him labs were good.  I told him I would work on getting approval for MRI and let him know when done.

## 2019-06-28 ENCOUNTER — Telehealth: Payer: Self-pay

## 2019-06-28 DIAGNOSIS — M76829 Posterior tibial tendinitis, unspecified leg: Secondary | ICD-10-CM

## 2019-06-28 NOTE — Telephone Encounter (Signed)
Per [employer] website, MRI has been approved from 06/25/2019 to 07/26/2019  Auth # [id number]  Patient has been notified of approval and will call scheduling dept and set up MRI appt to his convenience.

## 2019-06-28 NOTE — Telephone Encounter (Signed)
-----   Message from Kristian Covey, [facility] sent at 06/10/2019  2:57 PM EDT ----- Regarding: MRI MRI ankle right - evaluate posterior tibial tendon tear/plantar fascial tear right - surgical consideration

## 2019-07-04 ENCOUNTER — Ambulatory Visit: Payer: BLUE CROSS/BLUE SHIELD | Attending: Internal Medicine

## 2019-07-04 DIAGNOSIS — Z23 Encounter for immunization: Secondary | ICD-10-CM

## 2019-07-04 NOTE — Progress Notes (Signed)
   Covid-19 Vaccination Clinic  Name:  Ronald Spencer    MRN: [id number] DOB: 15-Jul-1960  07/04/2019  Mr. Stavola was observed post Covid-19 immunization for 15 minutes without incident. He was provided with Vaccine Information Sheet and instruction to access the V-Safe system.   Mr. Klus was instructed to call 911 with any severe reactions post vaccine: Marland Kitchen Difficulty breathing  . Swelling of face and throat  . A fast heartbeat  . A bad rash all over body  . Dizziness and weakness   Immunizations Administered    Name Date Dose VIS Date Route   Moderna COVID-19 Vaccine 07/04/2019  2:39 PM 0.5 mL 01/2019 Intramuscular   Manufacturer: Moderna   Lot: [id number]   NDC: 07354-301-48

## 2019-07-22 ENCOUNTER — Ambulatory Visit
Admission: RE | Admit: 2019-07-22 | Discharge: 2019-07-22 | Disposition: A | Discharge status: Home / Self Care | Payer: BC Managed Care – PPO | Source: Ambulatory Visit | Attending: Podiatry | Admitting: Podiatry

## 2019-07-22 ENCOUNTER — Other Ambulatory Visit: Payer: Self-pay

## 2019-07-22 DIAGNOSIS — M76829 Posterior tibial tendinitis, unspecified leg: Secondary | ICD-10-CM | POA: Diagnosis present

## 2019-07-30 ENCOUNTER — Telehealth: Payer: Self-pay | Admitting: *Deleted

## 2019-07-30 NOTE — Telephone Encounter (Signed)
Left message informing pt of Dr. Geryl Rankins request to send a copy of the MRI disc to a radiology specialist for more details for treatment planning, there would be a 10-14 day delay in the final results and we would contact him with instructions. Faxed request for MRI disc copy to [facility].

## 2019-07-30 NOTE — Telephone Encounter (Signed)
-----   Message from Elinor Parkinson, [place] sent at 07/28/2019  9:19 PM EDT ----- Ronald Spencer please request that over read.  Inform the patient of the delay.  Angie is out this week.  So could we go ahead and get this started.

## 2019-08-06 NOTE — Telephone Encounter (Signed)
Received copy of MRI disc and mailed to [facility]. 

## 2019-09-06 ENCOUNTER — Encounter: Payer: Self-pay | Admitting: *Deleted

## 2019-09-16 ENCOUNTER — Ambulatory Visit (INDEPENDENT_AMBULATORY_CARE_PROVIDER_SITE_OTHER): Payer: BC Managed Care – PPO | Admitting: Podiatry

## 2019-09-16 ENCOUNTER — Other Ambulatory Visit: Payer: Self-pay

## 2019-09-16 ENCOUNTER — Encounter: Payer: Self-pay | Admitting: Podiatry

## 2019-09-16 VITALS — Ht 72.0 in | Wt 275.0 lb

## 2019-09-16 DIAGNOSIS — M76822 Posterior tibial tendinitis, left leg: Secondary | ICD-10-CM | POA: Diagnosis not present

## 2019-09-16 DIAGNOSIS — M2141 Flat foot [pes planus] (acquired), right foot: Secondary | ICD-10-CM | POA: Diagnosis not present

## 2019-09-16 DIAGNOSIS — M76829 Posterior tibial tendinitis, unspecified leg: Secondary | ICD-10-CM

## 2019-09-16 DIAGNOSIS — M76821 Posterior tibial tendinitis, right leg: Secondary | ICD-10-CM | POA: Diagnosis not present

## 2019-09-16 DIAGNOSIS — M2142 Flat foot [pes planus] (acquired), left foot: Secondary | ICD-10-CM

## 2019-09-16 NOTE — Progress Notes (Signed)
  Subjective:  Patient ID: Ronald Spencer, male    DOB: 1960-12-23,  MRN: [id number]  Chief Complaint  Patient presents with  . Foot Pain    "its no better, still hurts and its making my left foot hurt some"    59 y.o. male presents with the above complaint. History confirmed with patient.  He is here today for MRI review and further treatment discussion.  His feet hurt mostly especially towards the end of the long day.  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  He is planning to retire in December.  Objective:  Physical Exam: warm, good capillary refill, no trophic changes or ulcerative lesions, normal DP and PT pulses and normal sensory exam.  Right Foot: Severe pes planus with pain all along the posterior tibial tendon with edema and warmth in the area.  He is unable to do single heel rise here.  Arch is completely collapsed to the ground when weightbears.  No pain laterally over sinus tarsi.  Ankle range of motion is limited with equinus present.  Subtalar joint range of motion is intact.  MRI dated 07/22/19 shows mild subtalar joint and talonavicular joint arthropathy with reactive marrow edema in the talar head.  The spring ligament appears to be intact, however could be attenuated.  Significant thickening and fluid along the posterior tibial tendon. Assessment:   1. PTTD (posterior tibial tendon dysfunction)   2. Posterior tibialis tendinitis of both lower extremities   3. Pes planus of both feet      Plan:  Patient was evaluated and treated and all questions answered.  -I reviewed the MRI results and physical exam findings with the patient.  I discussed with him that nonsurgical treatment would include further bracing (he has tried custom foot orthoses before and so they were not helpful), physical therapy (he does not think that he can do this with his job), I do not think injection therapy along the tendon would be a good idea as I believe it can weaken the collagen  fibers and increased risk of tendon rupture. -Surgical I discussed the following treatment options with him little flatfoot reconstruction with double arthrodesis of the talonavicular and subtalar joints versus osteotomies including double calcaneal osteotomy, FDL transfer and spring ligament, and a cotton osteotomy.  I discussed with him that a fusion would be more predictable and long-lasting correction however this would lead to some reduction in inversion eversion and his foot would likely feel stiffer.  Osteotomies and tendon reconstruction could afford him more mobility, however if it fails he would likely still need fusion in the future. -At this point he is planning to retire in December of this year, he would like to avoid surgery if at all possible.  I recommended that we try a more aggressive orthosis/brace.  I will have him back to see our pedorthist Ria Clock, I believe that he might benefit from a [place] supramalleolar orthosis that would fit into his work boots if possible.  A total of 35 minutes was spent today on the review of the patients medical record including previous imaging studies, taking of the history, examining the patient, the ordering of procedures/labs/tests/medications, coordination of care, and documentation in the chart.  Sharl Ma, DPM 09/16/2019   Return in about 6 weeks (around 10/28/2019) for with Dr Al Corpus, ASAP with [facility] Noble to get fitted for a brace.

## 2019-09-16 NOTE — Patient Instructions (Signed)
Start applying Voltaren gel (diclofenac gel) to the inside of the arch where the pain is 3-4x daily. This can be bought over the counter

## 2019-10-09 ENCOUNTER — Other Ambulatory Visit: Payer: BC Managed Care – PPO | Admitting: Orthotics

## 2019-10-09 ENCOUNTER — Other Ambulatory Visit: Payer: Self-pay

## 2019-10-28 ENCOUNTER — Ambulatory Visit: Payer: BC Managed Care – PPO | Admitting: Podiatry

## 2019-11-06 ENCOUNTER — Ambulatory Visit: Payer: BC Managed Care – PPO | Admitting: Podiatry

## 2019-11-25 ENCOUNTER — Ambulatory Visit: Payer: BC Managed Care – PPO | Admitting: Podiatry

## 2020-01-15 ENCOUNTER — Other Ambulatory Visit: Payer: Self-pay

## 2020-01-15 ENCOUNTER — Ambulatory Visit: Payer: BC Managed Care – PPO | Admitting: Orthotics

## 2020-01-15 DIAGNOSIS — M76829 Posterior tibial tendinitis, unspecified leg: Secondary | ICD-10-CM

## 2020-01-15 DIAGNOSIS — M76821 Posterior tibial tendinitis, right leg: Secondary | ICD-10-CM

## 2020-01-15 NOTE — Progress Notes (Signed)
Patient came to p/up b/l brace [facility]) per Dr. Lilian Kapur; he was concerned about shoe fit as they didn't fit well in [employer]; howver they did in his work boots/

## 2021-04-05 ENCOUNTER — Other Ambulatory Visit: Payer: Self-pay | Admitting: Podiatry

## 2021-08-12 IMAGING — MR MR ANKLE*R* W/O CM
5 series · 40 of 40 positions shown · non-contrast
Comparison: Right foot x-ray 02/09/2016

CLINICAL DATA: Medial right ankle pain and swelling for 3 years. No
known injury.

EXAM:
MRI OF THE RIGHT ANKLE WITHOUT CONTRAST
TECHNIQUE: Multiplanar, multisequence MR imaging of the ankle was performed. No
intravenous contrast was administered.

[Series 5: T2 fat-sat · axial · right · 3.0mm · 0.50mm/px · z∈[-70,+70]mm · 9 of 36 slices shown (1 of 2)]
[im 1/36]
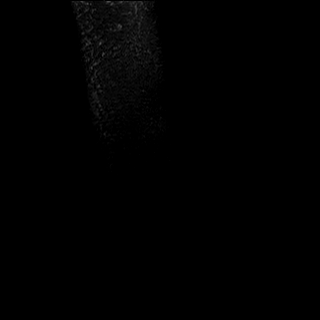
[im 5/36]
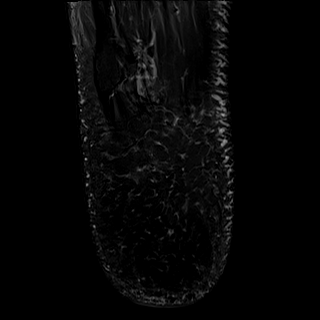
[im 9/36]
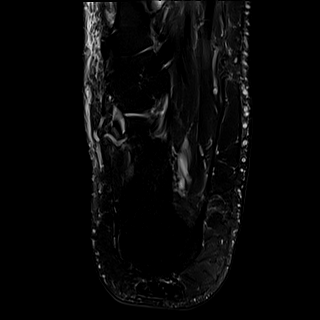
[im 14/36]
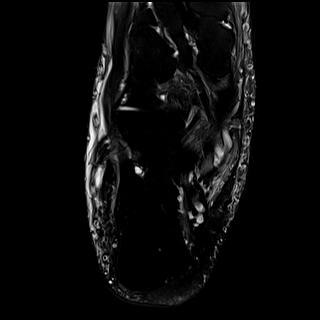
[im 18/36]
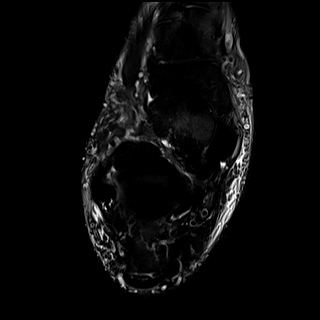
[im 22/36]
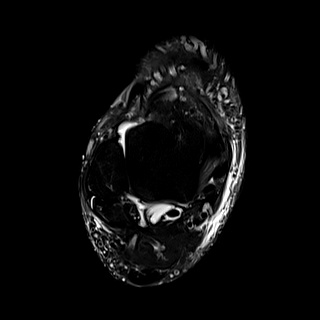
[im 27/36]
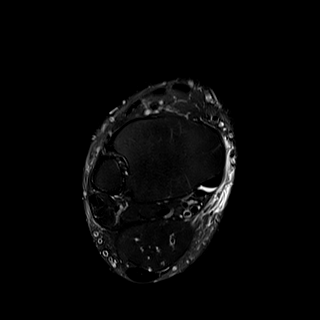
[im 31/36]
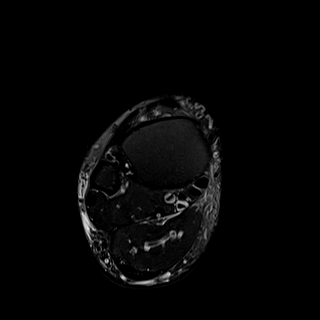
[im 36/36]
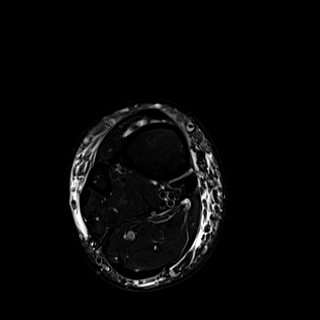

[Series 6: T2 fat-sat · coronal · right · 3.0mm · 0.62mm/px · 10 of 40 slices shown (2 of 2)]
[im 1/40]
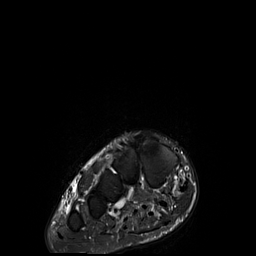
[im 5/40]
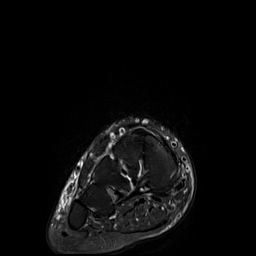
[im 9/40]
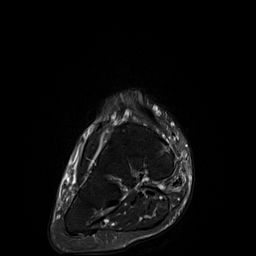
[im 14/40]
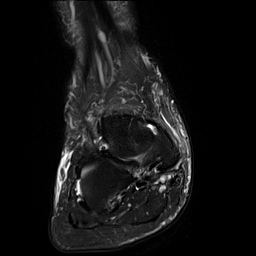
[im 18/40]
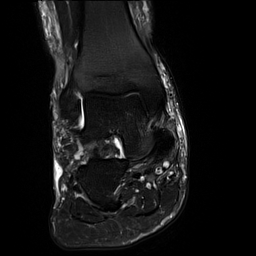
[im 22/40]
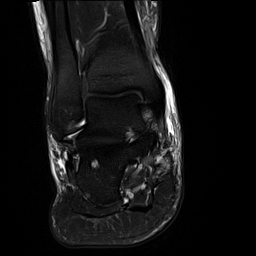
[im 27/40]
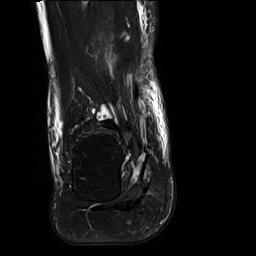
[im 31/40]
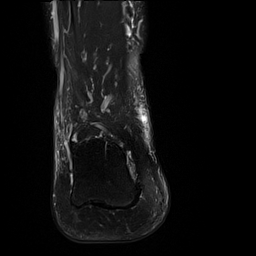
[im 35/40]
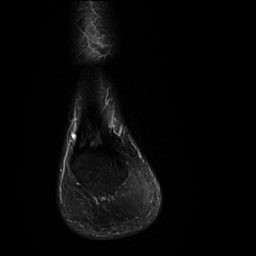
[im 40/40]
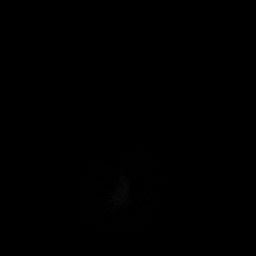

[Series 7: T1 · sagittal · right · 4.0mm · 0.70mm/px · 6 of 22 slices shown]
[im 1/22]
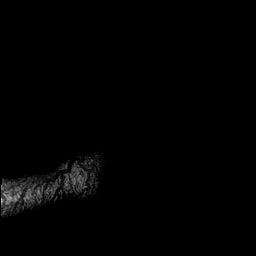
[im 5/22]
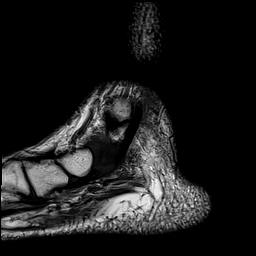
[im 9/22]
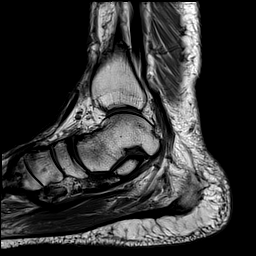
[im 13/22]
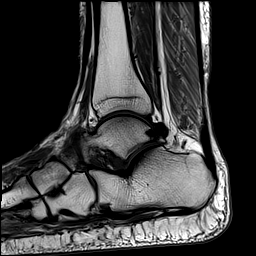
[im 17/22]
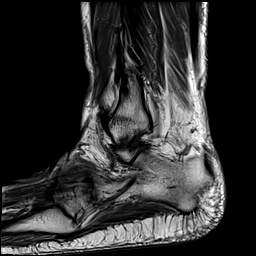
[im 22/22]
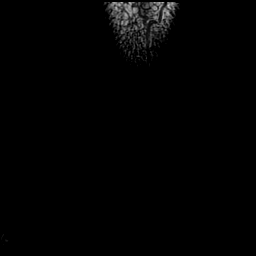

[Series 8: STIR · sagittal · right · 4.0mm · 0.35mm/px · 6 of 22 slices shown]
[im 1/22]
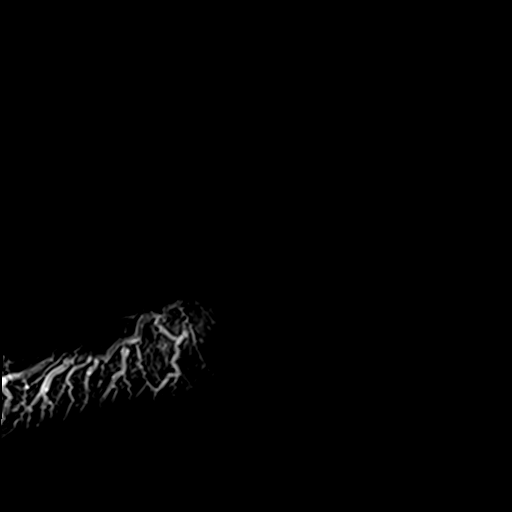
[im 5/22]
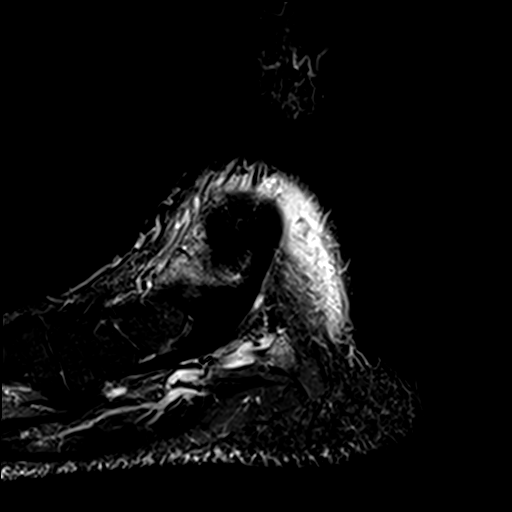
[im 9/22]
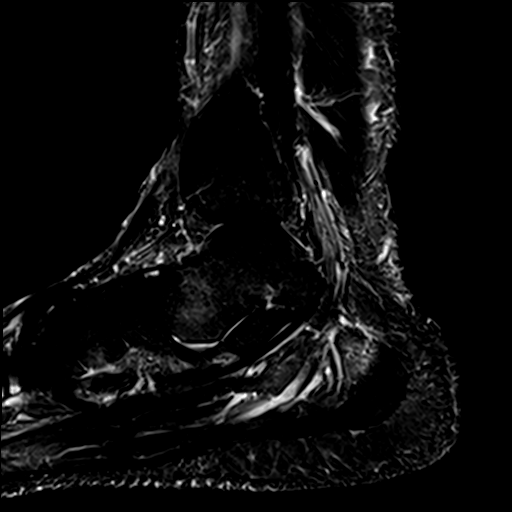
[im 13/22]
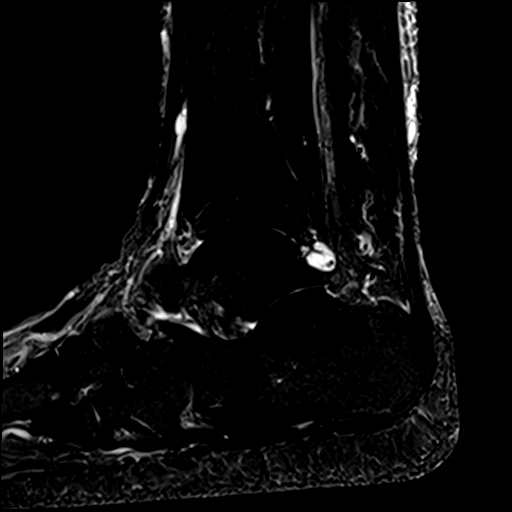
[im 17/22]
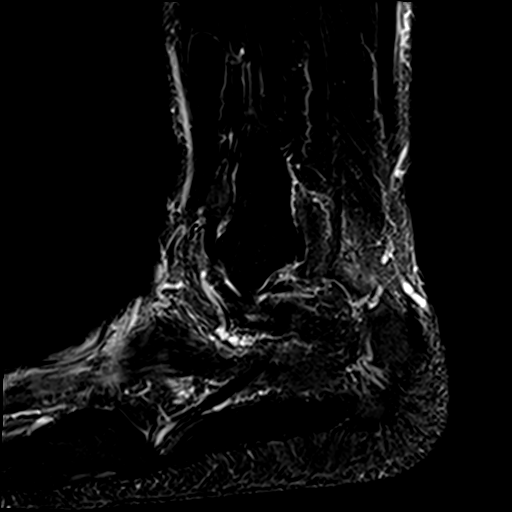
[im 22/22]
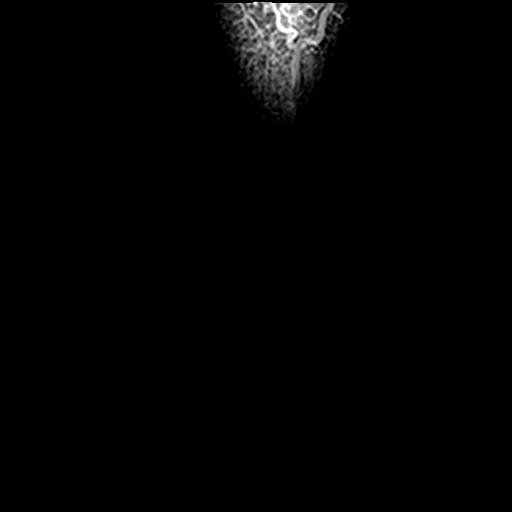

[Series 9: PD fat-sat · axial · right · 3.0mm · 0.50mm/px · z∈[-70,+70]mm · 9 of 36 slices shown]
[im 1/36]
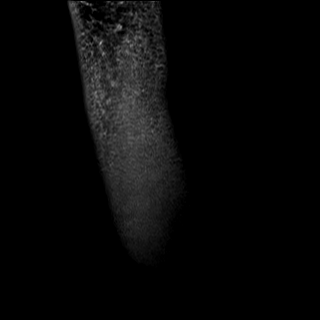
[im 5/36]
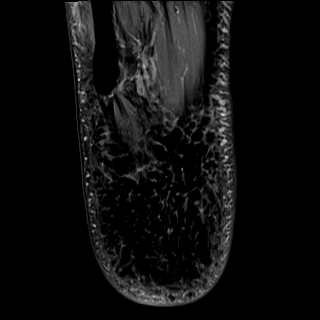
[im 9/36]
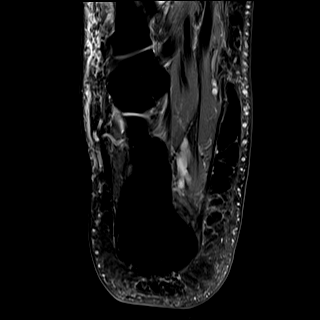
[im 14/36]
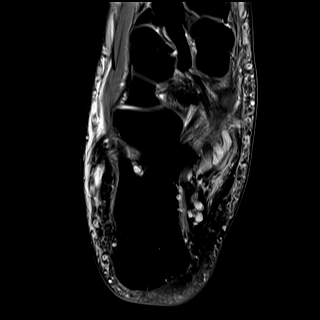
[im 18/36]
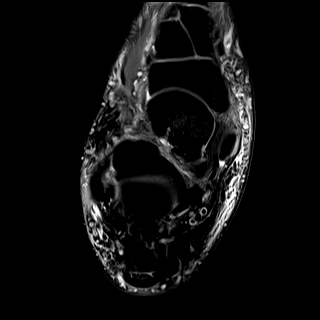
[im 22/36]
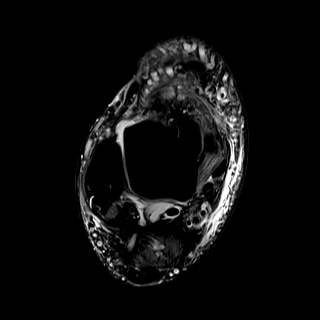
[im 27/36]
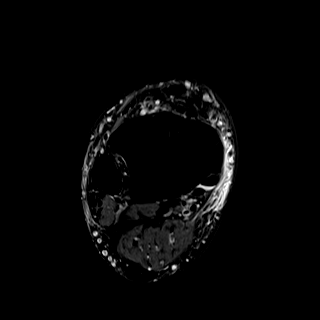
[im 31/36]
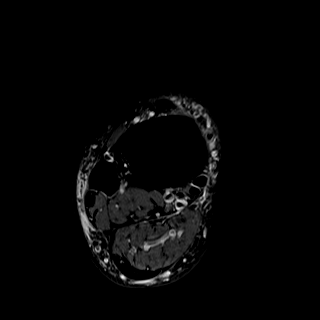
[im 36/36]
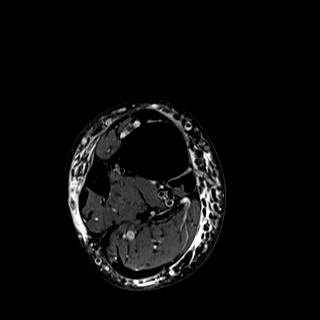

[40 of 40 positions shown; findings below may reference images not displayed]

FINDINGS: TENDONS

Peroneal: Note is made of peroneus quartus muscle and tendon, an
anatomic variant. Peroneus longus and peroneus brevis tendons appear
intact.

Posteromedial: Moderate tendinosis of the distal tibialis posterior
tendon. Flexor digitorum longus and flexor hallucis longus tendons
are intact.

Anterior: Intact tibialis anterior, extensor hallucis longus and
extensor digitorum longus tendons.

Achilles: Intact.

Plantar Fascia: There is borderline thickening of the central band
of the plantar fascia without perifascial edema or tear.

LIGAMENTS

Lateral: The anterior and posterior tibiofibular ligaments are
intact. The anterior and posterior talofibular ligaments are intact.
Intact calcaneofibular ligament.

Medial: Deltoid ligament and spring ligament complex intact.

CARTILAGE

Ankle Joint: No joint effusion or chondral defect.

Subtalar Joints/Sinus Tarsi: Mild arthropathy of the subtalar
joints. No subtalar joint effusion. Loss of the T1 fat signal within
sinus tarsi. No mass lesion.

Bones: Pes planus alignment. No acute fracture. No dislocation.
Faint T2 hyperintense marrow signal within the anteromedial talus.
Mild arthropathy within the midfoot and TMT joints.

Other: None.
IMPRESSION: 1. Moderate tendinosis of the distal tibialis posterior tendon.
2. Faint marrow edema within the anteromedial talus which may be
reactive/secondary to altered mechanics. No fracture.
3. Pes planus alignment with mild arthropathy of the subtalar joints
and midfoot.
4. Loss of the T1 fat signal within the sinus tarsi, which can be
seen in the setting of sinus tarsi syndrome.

## 2022-10-19 ENCOUNTER — Encounter: Payer: Self-pay | Admitting: Dermatology

## 2022-10-19 ENCOUNTER — Ambulatory Visit: Payer: BC Managed Care – PPO | Admitting: Dermatology

## 2022-10-19 VITALS — BP 180/90 | HR 81

## 2022-10-19 DIAGNOSIS — D1724 Benign lipomatous neoplasm of skin and subcutaneous tissue of left leg: Secondary | ICD-10-CM | POA: Diagnosis not present

## 2022-10-19 DIAGNOSIS — L219 Seborrheic dermatitis, unspecified: Secondary | ICD-10-CM | POA: Diagnosis not present

## 2022-10-19 DIAGNOSIS — D171 Benign lipomatous neoplasm of skin and subcutaneous tissue of trunk: Secondary | ICD-10-CM

## 2022-10-19 MED ORDER — KETOCONAZOLE 2 % EX CREA
TOPICAL_CREAM | CUTANEOUS | 11 refills | Status: DC
Start: 2022-10-19 — End: 2023-11-13

## 2022-10-19 MED ORDER — EUCRISA 2 % EX OINT
TOPICAL_OINTMENT | CUTANEOUS | 11 refills | Status: AC
Start: 2022-10-19 — End: ?

## 2022-10-19 NOTE — Patient Instructions (Addendum)
Seborrheic Dermatitis is a chronic persistent rash characterized by pinkness and scaling most commonly of the mid face but also can occur on the scalp (dandruff), ears; mid chest, mid back and groin.  It tends to be exacerbated by stress and cooler weather.  People who have neurologic disease may experience new onset or exacerbation of existing seborrheic dermatitis.  The condition is not curable but treatable and can be controlled.   Start eucrisa ointment apply topically to affected areas of rash at face and behind ears twice daily when flared Start ketoconazole cream - apply topically to affected areas of rash at face and behind ears twice daily when flared.   Send mychart if unable get medication        Pre-Operative Instructions  You are scheduled for a surgical procedure at [facility]. We recommend you read the following instructions. If you have any questions or concerns, please call the office at [phone].  Shower and wash the entire body with soap and water the day of your surgery paying special attention to cleansing at and around the planned surgery site.  Avoid aspirin or aspirin containing products at least fourteen (14) days prior to your surgical procedure and for at least one week (7 Days) after your surgical procedure. If you take aspirin on a regular basis for heart disease or history of stroke or for any other reason, we may recommend you continue taking aspirin but please notify us if you take this on a regular basis. Aspirin can cause more bleeding to occur during surgery as well as prolonged bleeding and bruising after surgery.   Avoid other nonsteroidal pain medications at least one week prior to surgery and at least one week prior to your surgery. These include medications such as Ibuprofen (Motrin, Advil and Nuprin), Naprosyn, Voltaren, Relafen, etc. If medications are used for therapeutic reasons, please inform us as they can cause increased bleeding or  prolonged bleeding during and bruising after surgical procedures.   Please advise [place] if you are taking any "blood thinner" medications such as Coumadin or Dipyridamole or Plavix or similar medications. These cause increased bleeding and prolonged bleeding during procedures and bruising after surgical procedures. We may have to consider discontinuing these medications briefly prior to and shortly after your surgery if safe to do so.   Please inform us of all medications you are currently taking. All medications that are taken regularly should be taken the day of surgery as you always do. Nevertheless, we need to be informed of what medications you are taking prior to surgery to know whether they will affect the procedure or cause any complications.   Please inform us of any medication allergies. Also inform us of whether you have allergies to Latex or rubber products or whether you have had any adverse reaction to Lidocaine or Epinephrine.  Please inform us of any prosthetic or artificial body parts such as artificial heart valve, joint replacements, etc., or similar condition that might require preoperative antibiotics.   We recommend avoidance of alcohol at least two weeks prior to surgery and continued avoidance for at least two weeks after surgery.   We recommend discontinuation of tobacco smoking at least two weeks prior to surgery and continued abstinence for at least two weeks after surgery.  Do not plan strenuous exercise, strenuous work or strenuous lifting for approximately four weeks after your surgery.   We request if you are unable to make your scheduled surgical appointment, please call us at least  a week in advance or as soon as you are aware of a problem so that we can cancel or reschedule the appointment.   You MAY TAKE TYLENOL (acetaminophen) for pain as it is not a blood thinner.   PLEASE PLAN TO BE IN TOWN FOR TWO WEEKS FOLLOWING SURGERY, THIS IS IMPORTANT SO YOU CAN BE CHECKED  FOR DRESSING CHANGES, SUTURE REMOVAL AND TO MONITOR FOR POSSIBLE COMPLICATIONS.    Due to recent changes in healthcare laws, you may see results of your pathology and/or laboratory studies on MyChart before the doctors have had a chance to review them. We understand that in some cases there may be results that are confusing or concerning to you. Please understand that not all results are received at the same time and often the doctors may need to interpret multiple results in order to provide you with the best plan of care or course of treatment. Therefore, we ask that you please give [place] 2 business days to thoroughly review all your results before contacting the office for clarification. Should we see a critical lab result, you will be contacted sooner.   If You Need Anything After Your Visit  If you have any questions or concerns for your doctor, please call our main line at [phone] and press option 4 to reach your doctor's medical assistant. If no one answers, please leave a voicemail as directed and we will return your call as soon as possible. Messages left after 4 pm will be answered the following business day.   You may also send [place] a message via MyChart. We typically respond to MyChart messages within 1-2 business days.  For prescription refills, please ask your pharmacy to contact our office. Our fax number is [phone].  If you have an urgent issue when the clinic is closed that cannot wait until the next business day, you can page your doctor at the number below.    Please note that while we do our best to be available for urgent issues outside of office hours, we are not available 24/7.   If you have an urgent issue and are unable to reach [place], you may choose to seek medical care at your doctor's office, retail clinic, urgent care center, or emergency room.  If you have a medical emergency, please immediately call 911 or go to the emergency department.  Pager Numbers  - Dr.  Gwen Pounds: [phone]  - Dr. Roseanne Reno: [phone]  - Dr. Katrinka Blazing: [phone]   In the event of inclement weather, please call our main line at [phone] for an update on the status of any delays or closures.  Dermatology Medication Tips: Please keep the boxes that topical medications come in in order to help keep track of the instructions about where and how to use these. Pharmacies typically print the medication instructions only on the boxes and not directly on the medication tubes.   If your medication is too expensive, please contact our office at [phone] option 4 or send [place] a message through MyChart.   We are unable to tell what your co-pay for medications will be in advance as this is different depending on your insurance coverage. However, we may be able to find a substitute medication at lower cost or fill out paperwork to get insurance to cover a needed medication.   If a prior authorization is required to get your medication covered by your insurance company, please allow [place] 1-2 business days to complete this process.  Drug prices often  vary depending on where the prescription is filled and some pharmacies may offer cheaper prices.  The website www.goodrx.com contains coupons for medications through different pharmacies. The prices here do not account for what the cost may be with help from insurance (it may be cheaper with your insurance), but the website can give you the price if you did not use any insurance.  - You can print the associated coupon and take it with your prescription to the pharmacy.  - You may also stop by our office during regular business hours and pick up a GoodRx coupon card.  - If you need your prescription sent electronically to a different pharmacy, notify our office through [facility] or by phone at [phone] option 4.     Si Usted Necesita Algo Despus de Su Visita  Tambin puede enviarnos un mensaje a travs de Clinical cytogeneticist. Por lo  general respondemos a los mensajes de MyChart en el transcurso de 1 a 2 das hbiles.  Para renovar recetas, por favor pida a su farmacia que se ponga en contacto con nuestra oficina. Annie Sable de fax es [place] [phone].  Si tiene un asunto urgente cuando la clnica est cerrada y que no puede esperar hasta el siguiente da hbil, puede llamar/localizar a su doctor(a) al nmero que aparece a continuacin.   Por favor, tenga en cuenta que aunque hacemos todo lo posible para estar disponibles para asuntos urgentes fuera del horario de [place], no estamos disponibles las 24 horas del da, los 7 [address]  [address] de la [place].   Si tiene un problema urgente y no puede comunicarse con nosotros, puede optar por buscar atencin mdica  en el consultorio de su doctor(a), en una clnica privada, en un centro de atencin urgente o en una sala de emergencias.  Si tiene Engineer, drilling, por favor llame inmediatamente al 911 o vaya a la sala de emergencias.  Nmeros de bper  - Dr. Gwen Pounds: [phone]  - Dra. Roseanne Reno: [phone]  - Dr. Katrinka Blazing: [phone]   En caso de inclemencias del tiempo, por favor llame a Lacy Duverney principal al [phone] para una actualizacin sobre el [place] de cualquier retraso o cierre.  Consejos para la medicacin en dermatologa: Por favor, guarde las cajas en las que vienen los medicamentos de uso tpico para ayudarle a seguir las instrucciones sobre dnde y cmo usarlos. Las farmacias generalmente imprimen las instrucciones del medicamento slo en las cajas y no directamente en los tubos del [place].   Si su medicamento es muy caro, por favor, pngase en contacto con Rolm Gala llamando al [phone] y presione la opcin 4 o envenos un mensaje a travs de Clinical cytogeneticist.   No podemos decirle cul ser su copago por los medicamentos por adelantado ya que esto es diferente dependiendo de la cobertura de su seguro. Sin embargo, es posible que podamos encontrar  un medicamento sustituto a Audiological scientist un formulario para que el seguro cubra el medicamento que se considera necesario.   Si se requiere una autorizacin previa para que su compaa de seguros [place] su medicamento, por favor permtanos de 1 a 2 das hbiles para completar [address]  Los precios de los medicamentos varan con frecuencia dependiendo del Environmental consultant de dnde se surte la receta y alguna farmacias pueden ofrecer precios ms baratos.  El sitio web www.goodrx.com tiene cupones para medicamentos de Health and safety inspector. Los precios aqu no tienen en cuenta lo que podra costar con la ayuda del seguro (puede ser ms barato con  su seguro), pero el sitio web puede darle el precio si no Visual merchandiser.  - Puede imprimir el cupn correspondiente y llevarlo con su receta a la farmacia.  - Tambin puede pasar por nuestra oficina durante el horario de atencin regular y Education officer, museum una tarjeta de cupones de GoodRx.  - Si necesita que su receta se enve electrnicamente a una farmacia diferente, informe a nuestra oficina a travs de MyChart de [place] o por telfono llamando al [phone] y presione la opcin 4.

## 2022-10-19 NOTE — Progress Notes (Signed)
   New Patient Visit   Subjective  Ronald Spencer is a 62 y.o. male who presents for the following:  Patient reports history of seb derm  at nose, eyebrows and behind ears given some ointment in past.  Also reports a growth at left hip he would like checked and to discuss treatment.   The patient has spots, moles and lesions to be evaluated, some may be new or changing and the patient may have concern these could be cancer.   The following portions of the chart were reviewed this encounter and updated as appropriate: medications, allergies, medical history  Review of Systems:  No other skin or systemic complaints except as noted in HPI or Assessment and Plan.  Objective  Well appearing patient in no apparent distress; mood and affect are within normal limits.    A focused examination was performed of the following areas: Ears, face, left hip, nose, scalp  Relevant exam findings are noted in the Assessment and Plan.    Assessment & Plan   SEBORRHEIC DERMATITIS Exam: no active lesions   Chronic and persistent condition with duration or expected duration over one year. currently at goal but will flare in winter.   Seborrheic Dermatitis is a chronic persistent rash characterized by pinkness and scaling most commonly of the mid face but also can occur on the scalp (dandruff), ears; mid chest, mid back and groin.  It tends to be exacerbated by stress and cooler weather.  People who have neurologic disease may experience new onset or exacerbation of existing seborrheic dermatitis.  The condition is not curable but treatable and can be controlled.  Treatment Plan:  Start ketoconazole 2 % cream - apply topically to affected areas of rash at face and behind ears twice daily prn.   Start Eucrisa ointment - apply topically to affected areas of rash at face and behind ears twice daily prn.  Samples given  Advised patient to call if unable to purchase medication.   Will recheck in 3  months   Lipoma   Exam: 7 x 12.5 cm Subcutaneous rubbery nodule on left hip    Treatment:   Benign-appearing. Exam most consistent with a lipoma. Discussed that a lipoma is a benign fatty growth that can grow over time and sometimes get irritated. Recommend observation if it is not bothersome or changing.   Patient is bothered by area and would like discuss removal. Discussed surgery to remove and closing skin with stitches. Patient deferred scheduling at this time and would like more time to consider.   Preop information included in patient's handout    Return in about 3 months (around 01/18/2023) for seb derm follow up.  I, Asher Muir, CMA, am acting as scribe for Elie Goody, MD.   Documentation: I have reviewed the above documentation for accuracy and completeness, and I agree with the above.  Elie Goody, MD

## 2023-01-18 ENCOUNTER — Ambulatory Visit: Payer: BC Managed Care – PPO | Admitting: Dermatology

## 2023-02-02 ENCOUNTER — Ambulatory Visit: Payer: BC Managed Care – PPO | Admitting: Dermatology

## 2023-02-07 ENCOUNTER — Encounter: Payer: Self-pay | Admitting: Dermatology

## 2023-02-07 ENCOUNTER — Ambulatory Visit: Payer: BC Managed Care – PPO | Admitting: Dermatology

## 2023-02-07 DIAGNOSIS — L219 Seborrheic dermatitis, unspecified: Secondary | ICD-10-CM | POA: Diagnosis not present

## 2023-02-07 NOTE — Progress Notes (Signed)
   Follow-Up Visit   Subjective  Ronald Spencer is a 63 y.o. male who presents for the following: Seb derm of the nose, eyebrows, and post auricular areas, currently using Ketoconazole  2% cream apply to aa's QD-BID PRN flares. Pt states condition doing much better since starting Ketoconazole  2% cream, but does flare in cooler weather. Pt didn't pick up Eucrisa  2% ointment, because the Ketoconazole  2% cream works well for him.   The following portions of the chart were reviewed this encounter and updated as appropriate: medications, allergies, medical history  Review of Systems:  No other skin or systemic complaints except as noted in HPI or Assessment and Plan.  Objective  Well appearing patient in no apparent distress; mood and affect are within normal limits.  A focused examination was performed of the following areas: the face and ears   Relevant exam findings are noted in the Assessment and Plan.   Assessment & Plan   SEBORRHEIC DERMATITIS Exam: slight erythema of glabella and alar creases  Chronic condition with duration or expected duration over one year. Currently well-controlled.  Seborrheic Dermatitis is a chronic persistent rash characterized by pinkness and scaling most commonly of the mid face but also can occur on the scalp (dandruff), ears; mid chest, mid back and groin.  It tends to be exacerbated by stress and cooler weather.  People who have neurologic disease may experience new onset or exacerbation of existing seborrheic dermatitis.  The condition is not curable but treatable and can be controlled.  Treatment Plan: Continue ketoconazole  2 % cream - apply topically to affected areas of rash at face and behind ears twice daily prn.  Can add anti-inflammatory if flaring despite ketoconazole    Return if symptoms worsen or fail to improve.  LILLETTE Rosina Mayans, CMA, am acting as scribe for Boneta Sharps, MD .   Documentation: I have reviewed the above  documentation for accuracy and completeness, and I agree with the above.  Boneta Sharps, MD

## 2023-02-07 NOTE — Patient Instructions (Signed)

## 2023-11-12 ENCOUNTER — Other Ambulatory Visit: Payer: Self-pay | Admitting: Dermatology

## 2023-11-12 DIAGNOSIS — L219 Seborrheic dermatitis, unspecified: Secondary | ICD-10-CM

## 2024-02-12 ENCOUNTER — Ambulatory Visit: Payer: BC Managed Care – PPO | Admitting: Dermatology
